# Patient Record
Sex: Male | Born: 1995 | Race: Black or African American | Hispanic: No | Marital: Single | Smoking: Never smoker
Health system: Southern US, Community
[De-identification: ages and names within clinical notes are randomized; demographics above are authoritative.]

---
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
## 2014-01-01 ENCOUNTER — Emergency Department (HOSPITAL_COMMUNITY)
Admission: EM | Admit: 2014-01-01 | Discharge: 2014-01-02 | Disposition: A | Discharge status: Home / Self Care | Payer: BC Managed Care – PPO | Attending: Emergency Medicine | Admitting: Emergency Medicine

## 2014-01-01 ENCOUNTER — Encounter (HOSPITAL_COMMUNITY): Payer: Self-pay | Admitting: Emergency Medicine

## 2014-01-01 DIAGNOSIS — L738 Other specified follicular disorders: Secondary | ICD-10-CM | POA: Insufficient documentation

## 2014-01-01 DIAGNOSIS — R21 Rash and other nonspecific skin eruption: Secondary | ICD-10-CM | POA: Diagnosis present

## 2014-01-01 DIAGNOSIS — L678 Other hair color and hair shaft abnormalities: Secondary | ICD-10-CM | POA: Diagnosis not present

## 2014-01-01 DIAGNOSIS — L739 Follicular disorder, unspecified: Secondary | ICD-10-CM

## 2014-01-01 DIAGNOSIS — Z792 Long term (current) use of antibiotics: Secondary | ICD-10-CM | POA: Diagnosis not present

## 2014-01-01 MED ORDER — MUPIROCIN 2 % EX OINT: TOPICAL_OINTMENT | Freq: Two times a day (BID) | CUTANEOUS | Status: AC

## 2014-01-01 MED ORDER — MUPIROCIN 2 % EX OINT
TOPICAL_OINTMENT | Freq: Two times a day (BID) | CUTANEOUS | Status: DC
  Administered 2014-01-02: via NASAL
  Filled 2014-01-01: qty 22

## 2014-01-01 NOTE — Discharge Instructions (Signed)

## 2014-01-01 NOTE — ED Provider Notes (Signed)
CSN: [id number]     Arrival date & time 01/01/14  2231 History  This chart was scribed for non-physician practitioner, Sabino Dick, NP-C working with Loren Racer, MD, by Luisa Dago, ED scribe. This patient was seen in room WTR9/WTR9 and the patient's care was started at 11:29 PM.      Chief Complaint  Patient presents with  . Rash    right arm   The history is provided by the patient. No language interpreter was used.   HPI Comments: Dillon Baker is a 18 y.o. male who presents to the Emergency Department complaining of a rash to his right arm that he first noticed 1 week ago. Pt reports using a new "tattoo soap" 1.5 weeks ago. He has also endorses applying several lotions and A&D ointment without any relief. Pt is complaining of associated itching. Mr.Surman's new tattoo (35 month old) is located in the same area as the rash. Denies any fever, chills, nausea, emesis, or abdominal pain.  History reviewed. No pertinent past medical history. History reviewed. No pertinent past surgical history. Family History  Problem Relation Age of Onset  . Thyroid disease Mother    History  Substance Use Topics  . Smoking status: Never Smoker   . Smokeless tobacco: Not on file  . Alcohol Use: No    Review of Systems  Constitutional: Negative for fever and chills.  HENT: Negative for congestion.   Gastrointestinal: Negative for abdominal pain.  Musculoskeletal: Negative for joint swelling.  Skin: Positive for rash.   Allergies  Sulfa antibiotics  Home Medications   Prior to Admission medications   Medication Sig Start Date End Date Taking? Authorizing Provider  mupirocin ointment (BACTROBAN) 2 % Place into the nose 2 (two) times daily. To R arm rash 01/01/14   Arman Filter, NP   BP 139/69  Pulse 63  Temp(Src) 98.5 F (36.9 C) (Oral)  Resp 16  SpO2 98%  Physical Exam  Nursing note and vitals reviewed. Constitutional: He is oriented to person, place, and time. He appears  well-developed and well-nourished.  HENT:  Head: Normocephalic.  Eyes: Pupils are equal, round, and reactive to light.  Cardiovascular: Normal rate.   Pulmonary/Chest: Effort normal.  Musculoskeletal: Normal range of motion.  Neurological: He is alert and oriented to person, place, and time.  Skin: Rash noted. No erythema.  Raised lesions along lines of tattoo and at base of hair shafts    ED Course  Procedures (including critical care time)  DIAGNOSTIC STUDIES: Oxygen Saturation is 98% on RA, normal by my interpretation.    COORDINATION OF CARE: 11:36 PM- Pt advised of plan for treatment and pt agrees.  Labs Review Labs Reviewed - No data to display  Imaging Review No results found.   EKG Interpretation None      MDM   Final diagnoses:  Folliculitis     Will give Bactroban ointment   I personally performed the services described in this documentation, which was scribed in my presence. The recorded information has been reviewed and is accurate.    Arman Filter, NP 01/01/14 [phone]

## 2014-01-01 NOTE — ED Notes (Addendum)
Patient c/o rash to right arm. Patient reports new soap @ 1- 1/2 weeks ago, rash started @ 1 week ago. Patient has tried "different types of lotions without relief, and also A&D ointment." Patient c/o some itching. Patient has not taken benadryl at this time.

## 2014-01-02 DIAGNOSIS — L678 Other hair color and hair shaft abnormalities: Secondary | ICD-10-CM | POA: Diagnosis not present

## 2014-01-02 DIAGNOSIS — L738 Other specified follicular disorders: Secondary | ICD-10-CM | POA: Diagnosis not present

## 2014-01-02 NOTE — ED Provider Notes (Signed)
Medical screening examination/treatment/procedure(s) were performed by non-physician practitioner and as supervising physician I was immediately available for consultation/collaboration.   EKG Interpretation None        [name], MD 01/02/14 0216 

## 2014-02-19 ENCOUNTER — Ambulatory Visit: Payer: Federal, State, Local not specified - PPO

## 2014-05-09 ENCOUNTER — Encounter (HOSPITAL_COMMUNITY): Payer: Self-pay

## 2014-05-09 ENCOUNTER — Emergency Department (HOSPITAL_COMMUNITY)
Admission: EM | Admit: 2014-05-09 | Discharge: 2014-05-09 | Disposition: A | Discharge status: Home / Self Care | Payer: BLUE CROSS/BLUE SHIELD | Attending: Emergency Medicine | Admitting: Emergency Medicine

## 2014-05-09 ENCOUNTER — Emergency Department (HOSPITAL_COMMUNITY): Payer: BLUE CROSS/BLUE SHIELD

## 2014-05-09 DIAGNOSIS — J069 Acute upper respiratory infection, unspecified: Secondary | ICD-10-CM | POA: Diagnosis not present

## 2014-05-09 DIAGNOSIS — R059 Cough, unspecified: Secondary | ICD-10-CM

## 2014-05-09 DIAGNOSIS — Z792 Long term (current) use of antibiotics: Secondary | ICD-10-CM | POA: Diagnosis not present

## 2014-05-09 DIAGNOSIS — R509 Fever, unspecified: Secondary | ICD-10-CM | POA: Diagnosis present

## 2014-05-09 DIAGNOSIS — J45909 Unspecified asthma, uncomplicated: Secondary | ICD-10-CM | POA: Insufficient documentation

## 2014-05-09 DIAGNOSIS — Z8701 Personal history of pneumonia (recurrent): Secondary | ICD-10-CM | POA: Insufficient documentation

## 2014-05-09 DIAGNOSIS — R05 Cough: Secondary | ICD-10-CM

## 2014-05-09 MED ORDER — ACETAMINOPHEN 325 MG PO TABS
325.0000 mg | ORAL_TABLET | Freq: Once | ORAL | Status: AC
  Administered 2014-05-09: 325 mg via ORAL
  Filled 2014-05-09: qty 1

## 2014-05-09 MED ORDER — DM-GUAIFENESIN ER 30-600 MG PO TB12: 1.0000 | ORAL_TABLET | Freq: Two times a day (BID) | ORAL | Status: AC

## 2014-05-09 MED ORDER — ALBUTEROL SULFATE HFA 108 (90 BASE) MCG/ACT IN AERS
2.0000 | INHALATION_SPRAY | Freq: Once | RESPIRATORY_TRACT | Status: AC
  Administered 2014-05-09: 2 via RESPIRATORY_TRACT
  Filled 2014-05-09: qty 6.7

## 2014-05-09 NOTE — ED Provider Notes (Signed)
CSN: [id number]     Arrival date & time 05/09/14  1606 History   First MD Initiated Contact with Patient 05/09/14 1744     No chief complaint on file.  The history is provided by the patient. No language interpreter was used.   This chart was scribed for non-physician practitioner Everlene FarrierWilliam Mohammed Mcandrew, PA-C,  working with No att. providers found, by Andrew Auaven Small, ED Scribe. This patient was seen in room WTR6/WTR6 and the patient's care was started at 10:56 PM.  Dillon Baker is a 19 y.o. male who presents to the Emergency Department complaining of subjective fever that began 4 days ago. Pt has taken nyquil last dose being 2 hours ago. He reports associated HA, body aches, fatigue, positional light headedness, decreased appetite, non productive cough. Pt denies sore throat, abdominal pain, otalgia, nasal congestion, chest congestion, chest tightness, wheezing, eye pain, nausea, emesis, SOB and wheezing. Pt denies receiving flu vaccine.    History reviewed. No pertinent past medical history. History reviewed. No pertinent past surgical history. Family History  Problem Relation Age of Onset  . Thyroid disease Mother    History  Substance Use Topics  . Smoking status: Never Smoker   . Smokeless tobacco: Not on file  . Alcohol Use: No    Review of Systems  Constitutional: Positive for fever, chills, appetite change ( decreased) and fatigue.  HENT: Negative for congestion, ear pain and sore throat.   Eyes: Negative for pain.  Respiratory: Negative for cough, shortness of breath and wheezing.   Cardiovascular: Negative for chest pain.  Gastrointestinal: Negative for nausea, vomiting and abdominal distention.  Musculoskeletal: Positive for myalgias.  Neurological: Positive for light-headedness and headaches.   Allergies  Sulfa antibiotics  Home Medications   Prior to Admission medications   Medication Sig Start Date End Date Taking? Authorizing Provider  dextromethorphan-guaiFENesin (MUCINEX  DM) 30-600 MG per 12 hr tablet Take 1 tablet by mouth 2 (two) times daily. 05/09/14   Einar GipWilliam Duncan Elivia Robotham, PA-C  mupirocin ointment (BACTROBAN) 2 % Place into the nose 2 (two) times daily. To R arm rash 01/01/14   Arman FilterGail K Schulz, NP   BP 133/73 mmHg  Pulse 87  Temp(Src) 99.1 F (37.3 C) (Oral)  Resp 20  SpO2 99% Physical Exam  Constitutional: He is oriented to person, place, and time. He appears well-developed and well-nourished. No distress.  HENT:  Head: Normocephalic and atraumatic.  Right Ear: External ear normal.  Left Ear: External ear normal.  Mouth/Throat: Uvula is midline and oropharynx is clear and moist. No oropharyngeal exudate, posterior oropharyngeal edema, posterior oropharyngeal erythema or tonsillar abscesses.  Eyes: Conjunctivae and EOM are normal. Pupils are equal, round, and reactive to light.  Neck: Neck supple.  Cardiovascular: Normal rate, regular rhythm and normal heart sounds.  Exam reveals no gallop and no friction rub.   No murmur heard. Pulmonary/Chest: Effort normal and breath sounds normal. No respiratory distress. He has no wheezes. He has no rales. He exhibits no tenderness.  Abdominal: Soft. There is no tenderness.  Musculoskeletal: Normal range of motion.  Lymphadenopathy:    He has no cervical adenopathy.  Neurological: He is alert and oriented to person, place, and time. Coordination normal.  Skin: Skin is warm and dry.  Psychiatric: He has a normal mood and affect. His behavior is normal.  Nursing note and vitals reviewed.   ED Course  Procedures (including critical care time) DIAGNOSTIC STUDIES: Oxygen Saturation is 100% on RA, normal by my interpretation.  COORDINATION OF CARE: 10:56 PM- Pt advised of plan for treatment and pt agrees.  Labs Review Labs Reviewed - No data to display  Imaging Review Dg Chest 2 View  05/09/2014   CLINICAL DATA:  Three day history of cough, fever, chest congestion, chest pain and headache.  EXAM: CHEST  2  VIEW  COMPARISON:  None.  FINDINGS: Cardiomediastinal silhouette unremarkable. Moderate central peribronchial thickening. Lungs otherwise clear. No localized airspace consolidation. No pleural effusions. No pneumothorax. Normal pulmonary vascularity. Visualized bony thorax intact.  IMPRESSION: Moderate changes of bronchitis and/or asthma without focal airspace pneumonia.   Electronically Signed   By: Hulan Saas M.D.   On: 05/09/2014 18:33     EKG Interpretation None      Filed Vitals:   05/09/14 1628 05/09/14 1934  BP: 133/73   Pulse: 80 87  Temp: 99.1 F (37.3 C)   TempSrc: Oral   Resp: 20   SpO2: 100% 99%     MDM   Meds given in ED:  Medications  acetaminophen (TYLENOL) tablet 325 mg (325 mg Oral Given 05/09/14 1809)  albuterol (PROVENTIL HFA;VENTOLIN HFA) 108 (90 BASE) MCG/ACT inhaler 2 puff (2 puffs Inhalation Given 05/09/14 1932)    Discharge Medication List as of 05/09/2014  7:02 PM    START taking these medications   Details  dextromethorphan-guaiFENesin (MUCINEX DM) 30-600 MG per 12 hr tablet Take 1 tablet by mouth 2 (two) times daily., Starting 05/09/2014, Until Discontinued, Print        Final diagnoses:  Upper respiratory infection, viral   This is a 19 year old male who presents emergency department complaining of 4 days of fever, cough, and myalgias. The patient is afebrile and nontoxic appearing. Patient has history of pneumonia or asthma. Patient's chest x-ray shows moderate changes of bronchitis and/or asthma without focal airspace pneumonia. Patient's lungs are clear to auscultation bilaterally. Patient provided albuterol inhaler in the ED. Patient provided prescription for Mucinex DM. I advised the patient to follow-up with their primary care provider this week. I advised the patient to return to the emergency department with new or worsening symptoms or new concerns. The patient verbalized understanding and agreement with plan.    I personally performed  the services described in this documentation, which was scribed in my presence. The recorded information has been reviewed and is accurate.    Lawana Chambers, PA-C 05/09/14 2259  Purvis Sheffield, MD 05/10/14 [phone]

## 2014-05-09 NOTE — ED Notes (Signed)
Pt with fever, cough, weak, generalized pain since Tuesday.  Pt taking nyquil at home with no relief.  Unknown fever at home.  Did not check.

## 2014-05-09 NOTE — Discharge Instructions (Signed)
Fever, Adult °A fever is a higher than normal body temperature. In an adult, an oral temperature around 98.6° F (37° C) is considered normal. A temperature of 100.4° F (38° C) or higher is generally considered a fever. Mild or moderate fevers generally have no long-term effects and often do not require treatment. Extreme fever (greater than or equal to 106° F or 41.1° C) can cause seizures. The sweating that may occur with repeated or prolonged fever may cause dehydration. Elderly people can develop confusion during a fever. °A measured temperature can vary with: °· Age. °· Time of day. °· Method of measurement (mouth, underarm, rectal, or ear). °The fever is confirmed by taking a temperature with a thermometer. Temperatures can be taken different ways. Some methods are accurate and some are not. °· An oral temperature is used most commonly. Electronic thermometers are fast and accurate. °· An ear temperature will only be accurate if the thermometer is positioned as recommended by the manufacturer. °· A rectal temperature is accurate and done for those adults who have a condition where an oral temperature cannot be taken. °· An underarm (axillary) temperature is not accurate and not recommended. °Fever is a symptom, not a disease.  °CAUSES  °· Infections commonly cause fever. °· Some noninfectious causes for fever include: °· Some arthritis conditions. °· Some thyroid or adrenal gland conditions. °· Some immune system conditions. °· Some types of cancer. °· A medicine reaction. °· High doses of certain street drugs such as methamphetamine. °· Dehydration. °· Exposure to high outside or room temperatures. °· Occasionally, the source of a fever cannot be determined. This is sometimes called a "fever of unknown origin" (FUO). °· Some situations may lead to a temporary rise in body temperature that may go away on its own. Examples are: °· Childbirth. °· Surgery. °· Intense exercise. °HOME CARE INSTRUCTIONS  °· Take  appropriate medicines for fever. Follow dosing instructions carefully. If you use acetaminophen to reduce the fever, be careful to avoid taking other medicines that also contain acetaminophen. Do not take aspirin for a fever if you are younger than age 19. There is an association with Reye's syndrome. Reye's syndrome is a rare but potentially deadly disease. °· If an infection is present and antibiotics have been prescribed, take them as directed. Finish them even if you start to feel better. °· Rest as needed. °· Maintain an adequate fluid intake. To prevent dehydration during an illness with prolonged or recurrent fever, you may need to drink extra fluid. Drink enough fluids to keep your urine clear or pale yellow. °· Sponging or bathing with room temperature water may help reduce body temperature. Do not use ice water or alcohol sponge baths. °· Dress comfortably, but do not over-bundle. °SEEK MEDICAL CARE IF:  °· You are unable to keep fluids down. °· You develop vomiting or diarrhea. °· You are not feeling at least partly better after 3 days. °· You develop new symptoms or problems. °SEEK IMMEDIATE MEDICAL CARE IF:  °· You have shortness of breath or trouble breathing. °· You develop excessive weakness. °· You are dizzy or you faint. °· You are extremely thirsty or you are making little or no urine. °· You develop new pain that was not there before (such as in the head, neck, chest, back, or abdomen). °· You have persistent vomiting and diarrhea for more than 1 to 2 days. °· You develop a stiff neck or your eyes become sensitive to light. °· You develop a   skin rash. °· You have a fever or persistent symptoms for more than 2 to 3 days. °· You have a fever and your symptoms suddenly get worse. °MAKE SURE YOU:  °· Understand these instructions. °· Will watch your condition. °· Will get help right away if you are not doing well or get worse. °Document Released: 10/05/2000 Document Revised: 08/26/2013 Document  Reviewed: 02/10/2011 °ExitCare® Patient Information ©2015 [employer]. This information is not intended to replace advice given to you by your health care provider. Make sure you discuss any questions you have with your health care provider. ° °Upper Respiratory Infection, Adult °An upper respiratory infection (URI) is also sometimes known as the common cold. The upper respiratory tract includes the nose, sinuses, throat, trachea, and bronchi. Bronchi are the airways leading to the lungs. Most people improve within 1 week, but symptoms can last up to 2 weeks. A residual cough may last even longer.  °CAUSES °Many different viruses can infect the tissues lining the upper respiratory tract. The tissues become irritated and inflamed and often become very moist. Mucus production is also common. A cold is contagious. You can easily spread the virus to others by oral contact. This includes kissing, sharing a glass, coughing, or sneezing. Touching your mouth or nose and then touching a surface, which is then touched by another person, can also spread the virus. °SYMPTOMS  °Symptoms typically develop 1 to 3 days after you come in contact with a cold virus. Symptoms vary from person to person. They may include: °· Runny nose. °· Sneezing. °· Nasal congestion. °· Sinus irritation. °· Sore throat. °· Loss of voice (laryngitis). °· Cough. °· Fatigue. °· Muscle aches. °· Loss of appetite. °· Headache. °· Low-grade fever. °DIAGNOSIS  °You might diagnose your own cold based on familiar symptoms, since most people get a cold 2 to 3 times a year. Your caregiver can confirm this based on your exam. Most importantly, your caregiver can check that your symptoms are not due to another disease such as strep throat, sinusitis, pneumonia, asthma, or epiglottitis. Blood tests, throat tests, and X-rays are not necessary to diagnose a common cold, but they may sometimes be helpful in excluding other more serious diseases. Your caregiver will  decide if any further tests are required. °RISKS AND COMPLICATIONS  °You may be at risk for a more severe case of the common cold if you smoke cigarettes, have chronic heart disease (such as heart failure) or lung disease (such as asthma), or if you have a weakened immune system. The very young and very old are also at risk for more serious infections. Bacterial sinusitis, middle ear infections, and bacterial pneumonia can complicate the common cold. The common cold can worsen asthma and chronic obstructive pulmonary disease (COPD). Sometimes, these complications can require emergency medical care and may be life-threatening. °PREVENTION  °The best way to protect against getting a cold is to practice good hygiene. Avoid oral or hand contact with people with cold symptoms. Wash your hands often if contact occurs. There is no clear evidence that vitamin C, vitamin E, echinacea, or exercise reduces the chance of developing a cold. However, it is always recommended to get plenty of rest and practice good nutrition. °TREATMENT  °Treatment is directed at relieving symptoms. There is no cure. Antibiotics are not effective, because the infection is caused by a virus, not by bacteria. Treatment may include: °· Increased fluid intake. Sports drinks offer valuable electrolytes, sugars, and fluids. °· Breathing heated mist   or steam (vaporizer or shower). °· Eating chicken soup or other clear broths, and maintaining good nutrition. °· Getting plenty of rest. °· Using gargles or lozenges for comfort. °· Controlling fevers with ibuprofen or acetaminophen as directed by your caregiver. °· Increasing usage of your inhaler if you have asthma. °Zinc gel and zinc lozenges, taken in the first 24 hours of the common cold, can shorten the duration and lessen the severity of symptoms. Pain medicines may help with fever, muscle aches, and throat pain. A variety of non-prescription medicines are available to treat congestion and runny nose.  Your caregiver can make recommendations and may suggest nasal or lung inhalers for other symptoms.  °HOME CARE INSTRUCTIONS  °· Only take over-the-counter or prescription medicines for pain, discomfort, or fever as directed by your caregiver. °· Use a warm mist humidifier or inhale steam from a shower to increase air moisture. This may keep secretions moist and make it easier to breathe. °· Drink enough water and fluids to keep your urine clear or pale yellow. °· Rest as needed. °· Return to work when your temperature has returned to normal or as your caregiver advises. You may need to stay home longer to avoid infecting others. You can also use a face mask and careful hand washing to prevent spread of the virus. °SEEK MEDICAL CARE IF:  °· After the first few days, you feel you are getting worse rather than better. °· You need your caregiver's advice about medicines to control symptoms. °· You develop chills, worsening shortness of breath, or brown or red sputum. These may be signs of pneumonia. °· You develop yellow or brown nasal discharge or pain in the face, especially when you bend forward. These may be signs of sinusitis. °· You develop a fever, swollen neck glands, pain with swallowing, or white areas in the back of your throat. These may be signs of strep throat. °SEEK IMMEDIATE MEDICAL CARE IF:  °· You have a fever. °· You develop severe or persistent headache, ear pain, sinus pain, or chest pain. °· You develop wheezing, a prolonged cough, cough up blood, or have a change in your usual mucus (if you have chronic lung disease). °· You develop sore muscles or a stiff neck. °Document Released: 10/05/2000 Document Revised: 07/04/2011 Document Reviewed: 07/17/2013 °ExitCare® Patient Information ©2015 [employer]. This information is not intended to replace advice given to you by your health care provider. Make sure you discuss any questions you have with your health care provider. ° °

## 2014-06-04 ENCOUNTER — Emergency Department (HOSPITAL_COMMUNITY): Payer: BLUE CROSS/BLUE SHIELD

## 2014-06-04 ENCOUNTER — Emergency Department (HOSPITAL_COMMUNITY)
Admission: EM | Admit: 2014-06-04 | Discharge: 2014-06-05 | Disposition: A | Discharge status: Home / Self Care | Payer: BLUE CROSS/BLUE SHIELD | Attending: Emergency Medicine | Admitting: Emergency Medicine

## 2014-06-04 ENCOUNTER — Encounter (HOSPITAL_COMMUNITY): Payer: Self-pay | Admitting: Emergency Medicine

## 2014-06-04 DIAGNOSIS — R51 Headache: Secondary | ICD-10-CM | POA: Insufficient documentation

## 2014-06-04 DIAGNOSIS — Z792 Long term (current) use of antibiotics: Secondary | ICD-10-CM | POA: Insufficient documentation

## 2014-06-04 DIAGNOSIS — Z79899 Other long term (current) drug therapy: Secondary | ICD-10-CM | POA: Diagnosis not present

## 2014-06-04 DIAGNOSIS — R519 Headache, unspecified: Secondary | ICD-10-CM

## 2014-06-04 DIAGNOSIS — R1084 Generalized abdominal pain: Secondary | ICD-10-CM | POA: Insufficient documentation

## 2014-06-04 DIAGNOSIS — R112 Nausea with vomiting, unspecified: Secondary | ICD-10-CM | POA: Diagnosis present

## 2014-06-04 LAB — COMPREHENSIVE METABOLIC PANEL
ALK PHOS: 101 U/L (ref 39–117)
ALT: 22 U/L (ref 0–53)
AST: 35 U/L (ref 0–37)
Albumin: 5.3 g/dL — ABNORMAL HIGH (ref 3.5–5.2)
Anion gap: 13 (ref 5–15)
BILIRUBIN TOTAL: 1 mg/dL (ref 0.3–1.2)
BUN: 23 mg/dL (ref 6–23)
CO2: 23 mmol/L (ref 19–32)
Calcium: 10 mg/dL (ref 8.4–10.5)
Chloride: 103 mmol/L (ref 96–112)
Creatinine, Ser: 1.09 mg/dL (ref 0.50–1.35)
Glucose, Bld: 95 mg/dL (ref 70–99)
Potassium: 5.2 mmol/L — ABNORMAL HIGH (ref 3.5–5.1)
Sodium: 139 mmol/L (ref 135–145)
Total Protein: 8.9 g/dL — ABNORMAL HIGH (ref 6.0–8.3)

## 2014-06-04 LAB — CBC WITH DIFFERENTIAL/PLATELET
Basophils Absolute: 0 10*3/uL (ref 0.0–0.1)
Basophils Relative: 0 % (ref 0–1)
Eosinophils Absolute: 0.1 10*3/uL (ref 0.0–0.7)
Eosinophils Relative: 1 % (ref 0–5)
HCT: 49.7 % (ref 39.0–52.0)
HEMOGLOBIN: 16.5 g/dL (ref 13.0–17.0)
LYMPHS ABS: 1.6 10*3/uL (ref 0.7–4.0)
Lymphocytes Relative: 10 % — ABNORMAL LOW (ref 12–46)
MCH: 29.2 pg (ref 26.0–34.0)
MCHC: 33.2 g/dL (ref 30.0–36.0)
MCV: 88 fL (ref 78.0–100.0)
MONOS PCT: 4 % (ref 3–12)
Monocytes Absolute: 0.6 10*3/uL (ref 0.1–1.0)
NEUTROS PCT: 85 % — AB (ref 43–77)
Neutro Abs: 13.5 10*3/uL — ABNORMAL HIGH (ref 1.7–7.7)
Platelets: 224 10*3/uL (ref 150–400)
RBC: 5.65 MIL/uL (ref 4.22–5.81)
RDW: 12.8 % (ref 11.5–15.5)
WBC: 15.8 10*3/uL — ABNORMAL HIGH (ref 4.0–10.5)

## 2014-06-04 LAB — LIPASE, BLOOD: Lipase: 21 U/L (ref 11–59)

## 2014-06-04 MED ORDER — ONDANSETRON HCL 4 MG/2ML IJ SOLN
4.0000 mg | Freq: Once | INTRAMUSCULAR | Status: AC
  Administered 2014-06-04: 4 mg via INTRAVENOUS
  Filled 2014-06-04: qty 2

## 2014-06-04 MED ORDER — SODIUM CHLORIDE 0.9 % IV BOLUS (SEPSIS)
1000.0000 mL | Freq: Once | INTRAVENOUS | Status: AC
  Administered 2014-06-04: 1000 mL via INTRAVENOUS

## 2014-06-04 MED ORDER — FENTANYL CITRATE 0.05 MG/ML IJ SOLN
50.0000 ug | Freq: Once | INTRAMUSCULAR | Status: AC
  Administered 2014-06-04: 50 ug via INTRAVENOUS
  Filled 2014-06-04: qty 2

## 2014-06-04 NOTE — ED Notes (Signed)
Patient states that he started having a headache and vomiting approx. 15-20 mins ago. Alert and oriented.

## 2014-06-04 NOTE — ED Provider Notes (Signed)
CSN: [id number]     Arrival date & time 06/04/14  2201 History   First MD Initiated Contact with Patient 06/04/14 2301     Chief Complaint  Patient presents with  . Emesis     (Consider location/radiation/quality/duration/timing/severity/associated sxs/prior Treatment) HPI Patient presents with multiple episodes of forceful vomiting starting within the last hour. Patient states he's vomited 5 times. Patient denies any diarrhea. He has mild diffuse abdominal pain. No fever or chills. Patient states that after several episodes of vomiting he developed a right-sided headache. This is gradual onset. Not associated with photophobia or visual changes. No neck pain or stiffness. Describes the headache as throbbing and located in the right frontal area. The headache has improved since the vomiting has subsided. Patient denies similar headaches in the past. No focal weakness or numbness. Patient last ate [employer] 7 hours ago. No known sick contacts. No recent travel history. History reviewed. No pertinent past medical history. History reviewed. No pertinent past surgical history. Family History  Problem Relation Age of Onset  . Thyroid disease Mother    History  Substance Use Topics  . Smoking status: Never Smoker   . Smokeless tobacco: Not on file  . Alcohol Use: No    Review of Systems  Constitutional: Negative for fever and chills.  HENT: Negative for congestion, rhinorrhea and sinus pressure.   Eyes: Negative for photophobia and visual disturbance.  Respiratory: Negative for shortness of breath.   Cardiovascular: Negative for chest pain.  Gastrointestinal: Positive for nausea, vomiting and abdominal pain. Negative for diarrhea, constipation and blood in stool.  Musculoskeletal: Negative for back pain, neck pain and neck stiffness.  Neurological: Positive for headaches. Negative for dizziness, syncope, weakness, light-headedness and numbness.  All other systems reviewed and are  negative.     Allergies  Sulfa antibiotics  Home Medications   Prior to Admission medications   Medication Sig Start Date End Date Taking? Authorizing Provider  albuterol (PROVENTIL HFA;VENTOLIN HFA) 108 (90 BASE) MCG/ACT inhaler Inhale 2 puffs into the lungs every 6 (six) hours as needed for wheezing or shortness of breath.   Yes Historical Provider, MD  dextromethorphan-guaiFENesin (MUCINEX DM) 30-600 MG per 12 hr tablet Take 1 tablet by mouth 2 (two) times daily. Patient not taking: Reported on 06/04/2014 05/09/14   Einar GipWilliam Duncan Dansie, PA-C  mupirocin ointment (BACTROBAN) 2 % Place into the nose 2 (two) times daily. To R arm rash Patient not taking: Reported on 06/04/2014 01/01/14   Arman FilterGail K Schulz, NP   BP 126/79 mmHg  Pulse 98  Temp(Src) 98.1 F (36.7 C)  Resp 20  SpO2 100% Physical Exam  Constitutional: He is oriented to person, place, and time. He appears well-developed and well-nourished. No distress.  HENT:  Head: Normocephalic and atraumatic.  Mouth/Throat: Oropharynx is clear and moist. No oropharyngeal exudate.  No sinus tenderness to percussion.  Eyes: EOM are normal. Pupils are equal, round, and reactive to light.  No nystagmus  Neck: Normal range of motion. Neck supple.  No meningismus  Cardiovascular: Normal rate and regular rhythm.  Exam reveals no gallop and no friction rub.   No murmur heard. Pulmonary/Chest: Effort normal and breath sounds normal. No respiratory distress. He has no wheezes. He has no rales. He exhibits no tenderness.  Abdominal: Soft. Bowel sounds are normal. He exhibits no distension and no mass. There is tenderness (very mild diffuse tenderness to palpation. No focality. No rebound or guarding.). There is no rebound and no guarding.  Musculoskeletal:  Normal range of motion. He exhibits no edema or tenderness.  Neurological: He is alert and oriented to person, place, and time.  Patient is alert and oriented x3 with clear, goal oriented speech.  Patient has 5/5 motor in all extremities. Sensation is intact to light touch. Bilateral finger-to-nose is normal with no signs of dysmetria. Patient has a normal gait and walks without assistance.  Skin: Skin is warm and dry. No rash noted. No erythema.  Psychiatric: He has a normal mood and affect. His behavior is normal.  Nursing note and vitals reviewed.   ED Course  Procedures (including critical care time) Labs Review Labs Reviewed  CBC WITH DIFFERENTIAL/PLATELET - Abnormal; Notable for the following:    WBC 15.8 (*)    Neutrophils Relative % 85 (*)    Neutro Abs 13.5 (*)    Lymphocytes Relative 10 (*)    All other components within normal limits  COMPREHENSIVE METABOLIC PANEL - Abnormal; Notable for the following:    Potassium 5.2 (*)    Total Protein 8.9 (*)    Albumin 5.3 (*)    All other components within normal limits  LIPASE, BLOOD    Imaging Review Ct Head Wo Contrast  06/05/2014   CLINICAL DATA:  Headache and vomiting about 20 min ago.  EXAM: CT HEAD WITHOUT CONTRAST  TECHNIQUE: Contiguous axial images were obtained from the base of the skull through the vertex without intravenous contrast.  COMPARISON:  None.  FINDINGS: Ventricles and sulci appear symmetrical. No mass effect or midline shift. No abnormal extra-axial fluid collections. Gray-white matter junctions are distinct. Basal cisterns are not effaced. No evidence of acute intracranial hemorrhage. No depressed skull fractures. Visualized paranasal sinuses and mastoid air cells are not opacified.  IMPRESSION: No acute intracranial abnormalities.  Normal examination.   Electronically Signed   By: Burman Nieves M.D.   On: 06/05/2014 00:50     EKG Interpretation None      MDM   Final diagnoses:  None    Patient presents with acute vomiting. States he developed a right-sided headache shortly after starting vomiting. He attributes the headache to the vomiting. He has a normal neurologic exam. He states the  headache is improved. There is no neck pain or stiffness. No fever or chills. Low suspicion for [facility] though believe that a CT scan effectively exclude this given onset 1 hour ago.   Headache is resolved. Neurologic exam remains normal. No further vomiting. Patient states the feeling much better. CT head without any acute findings. Discharge home with return precautions.  Loren Racer, MD 06/05/14 [phone]

## 2014-06-05 MED ORDER — ONDANSETRON 4 MG PO TBDP: ORAL_TABLET | ORAL | Status: AC

## 2014-06-05 NOTE — Discharge Instructions (Signed)
Nausea and Vomiting °Nausea is a sick feeling that often comes before throwing up (vomiting). Vomiting is a reflex where stomach contents come out of your mouth. Vomiting can cause severe loss of body fluids (dehydration). Children and elderly adults can become dehydrated quickly, especially if they also have diarrhea. Nausea and vomiting are symptoms of a condition or disease. It is important to find the cause of your symptoms. °CAUSES  °· Direct irritation of the stomach lining. This irritation can result from increased acid production (gastroesophageal reflux disease), infection, food poisoning, taking certain medicines (such as nonsteroidal anti-inflammatory drugs), alcohol use, or tobacco use. °· Signals from the brain. These signals could be caused by a headache, heat exposure, an inner ear disturbance, increased pressure in the brain from injury, infection, a tumor, or a concussion, pain, emotional stimulus, or metabolic problems. °· An obstruction in the gastrointestinal tract (bowel obstruction). °· Illnesses such as diabetes, hepatitis, gallbladder problems, appendicitis, kidney problems, cancer, sepsis, atypical symptoms of a heart attack, or eating disorders. °· Medical treatments such as chemotherapy and radiation. °· Receiving medicine that makes you sleep (general anesthetic) during surgery. °DIAGNOSIS °Your caregiver may ask for tests to be done if the problems do not improve after a few days. Tests may also be done if symptoms are severe or if the reason for the nausea and vomiting is not clear. Tests may include: °· Urine tests. °· Blood tests. °· Stool tests. °· Cultures (to look for evidence of infection). °· X-rays or other imaging studies. °Test results can help your caregiver make decisions about treatment or the need for additional tests. °TREATMENT °You need to stay well hydrated. Drink frequently but in small amounts. You may wish to drink water, sports drinks, clear broth, or eat frozen  ice pops or gelatin dessert to help stay hydrated. When you eat, eating slowly may help prevent nausea. There are also some antinausea medicines that may help prevent nausea. °HOME CARE INSTRUCTIONS  °· Take all medicine as directed by your caregiver. °· If you do not have an appetite, do not force yourself to eat. However, you must continue to drink fluids. °· If you have an appetite, eat a normal diet unless your caregiver tells you differently. °· Eat a variety of complex carbohydrates (rice, wheat, potatoes, bread), lean meats, yogurt, fruits, and vegetables. °· Avoid high-fat foods because they are more difficult to digest. °· Drink enough water and fluids to keep your urine clear or pale yellow. °· If you are dehydrated, ask your caregiver for specific rehydration instructions. Signs of dehydration may include: °· Severe thirst. °· Dry lips and mouth. °· Dizziness. °· Dark urine. °· Decreasing urine frequency and amount. °· Confusion. °· Rapid breathing or pulse. °SEEK IMMEDIATE MEDICAL CARE IF:  °· You have blood or brown flecks (like coffee grounds) in your vomit. °· You have black or bloody stools. °· You have a severe headache or stiff neck. °· You are confused. °· You have severe abdominal pain. °· You have chest pain or trouble breathing. °· You do not urinate at least once every 8 hours. °· You develop cold or clammy skin. °· You continue to vomit for longer than 24 to 48 hours. °· You have a fever. °MAKE SURE YOU:  °· Understand these instructions. °· Will watch your condition. °· Will get help right away if you are not doing well or get worse. °Document Released: 04/11/2005 Document Revised: 07/04/2011 Document Reviewed: 09/08/2010 °[employer]® Patient Information ©2015 [employer]. This information is not intended   to replace advice given to you by your health care provider. Make sure you discuss any questions you have with your health care provider. °General Headache Without Cause °A headache is pain  or discomfort felt around the head or neck area. The specific cause of a headache may not be found. There are many causes and types of headaches. A few common ones are: °· Tension headaches. °· Migraine headaches. °· Cluster headaches. °· Chronic daily headaches. °HOME CARE INSTRUCTIONS  °· Keep all follow-up appointments with your caregiver or any specialist referral. °· Only take over-the-counter or prescription medicines for pain or discomfort as directed by your caregiver. °· Lie down in a dark, quiet room when you have a headache. °· Keep a headache journal to find out what may trigger your migraine headaches. For example, write down: °¨ What you eat and drink. °¨ How much sleep you get. °¨ Any change to your diet or medicines. °· Try massage or other relaxation techniques. °· Put ice packs or heat on the head and neck. Use these 3 to 4 times per day for 15 to 20 minutes each time, or as needed. °· Limit stress. °· Sit up straight, and do not tense your muscles. °· Quit smoking if you smoke. °· Limit alcohol use. °· Decrease the amount of caffeine you drink, or stop drinking caffeine. °· Eat and sleep on a regular schedule. °· Get 7 to 9 hours of sleep, or as recommended by your caregiver. °· Keep lights dim if bright lights bother you and make your headaches worse. °SEEK MEDICAL CARE IF:  °· You have problems with the medicines you were prescribed. °· Your medicines are not working. °· You have a change from the usual headache. °· You have nausea or vomiting. °SEEK IMMEDIATE MEDICAL CARE IF:  °· Your headache becomes severe. °· You have a fever. °· You have a stiff neck. °· You have loss of vision. °· You have muscular weakness or loss of muscle control. °· You start losing your balance or have trouble walking. °· You feel faint or pass out. °· You have severe symptoms that are different from your first symptoms. °MAKE SURE YOU:  °· Understand these instructions. °· Will watch your condition. °· Will get help  right away if you are not doing well or get worse. °Document Released: 04/11/2005 Document Revised: 07/04/2011 Document Reviewed: 04/27/2011 °[employer]® Patient Information ©2015 [employer]. This information is not intended to replace advice given to you by your health care provider. Make sure you discuss any questions you have with your health care provider. ° °

## 2016-09-15 IMAGING — CR DG CHEST 2V
2 series · 2 of 2 positions shown · non-contrast
Comparison: None.

CLINICAL DATA: Three day history of cough, fever, chest congestion,
chest pain and headache.

EXAM:
CHEST  2 VIEW

[w chest pa]
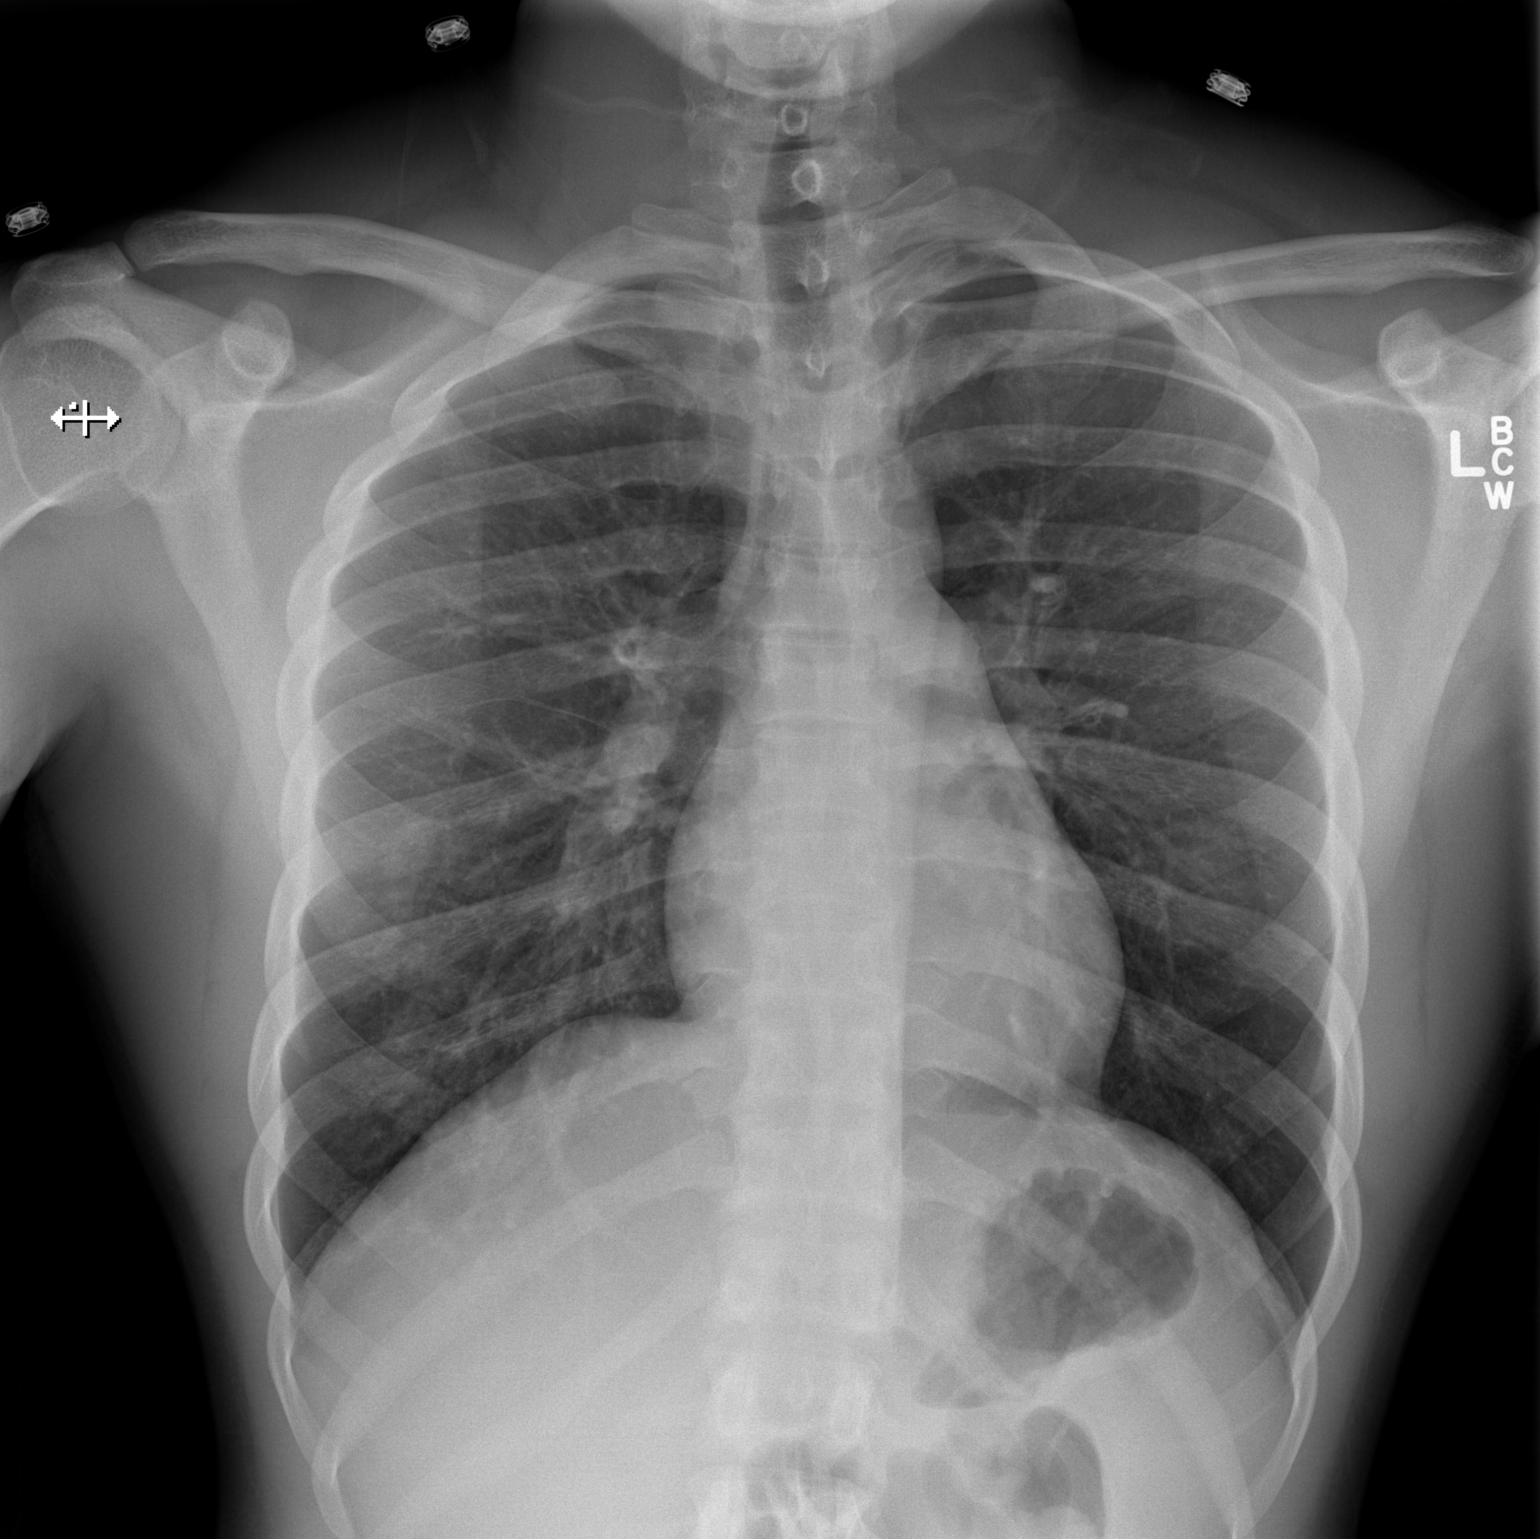

[w chest lat]
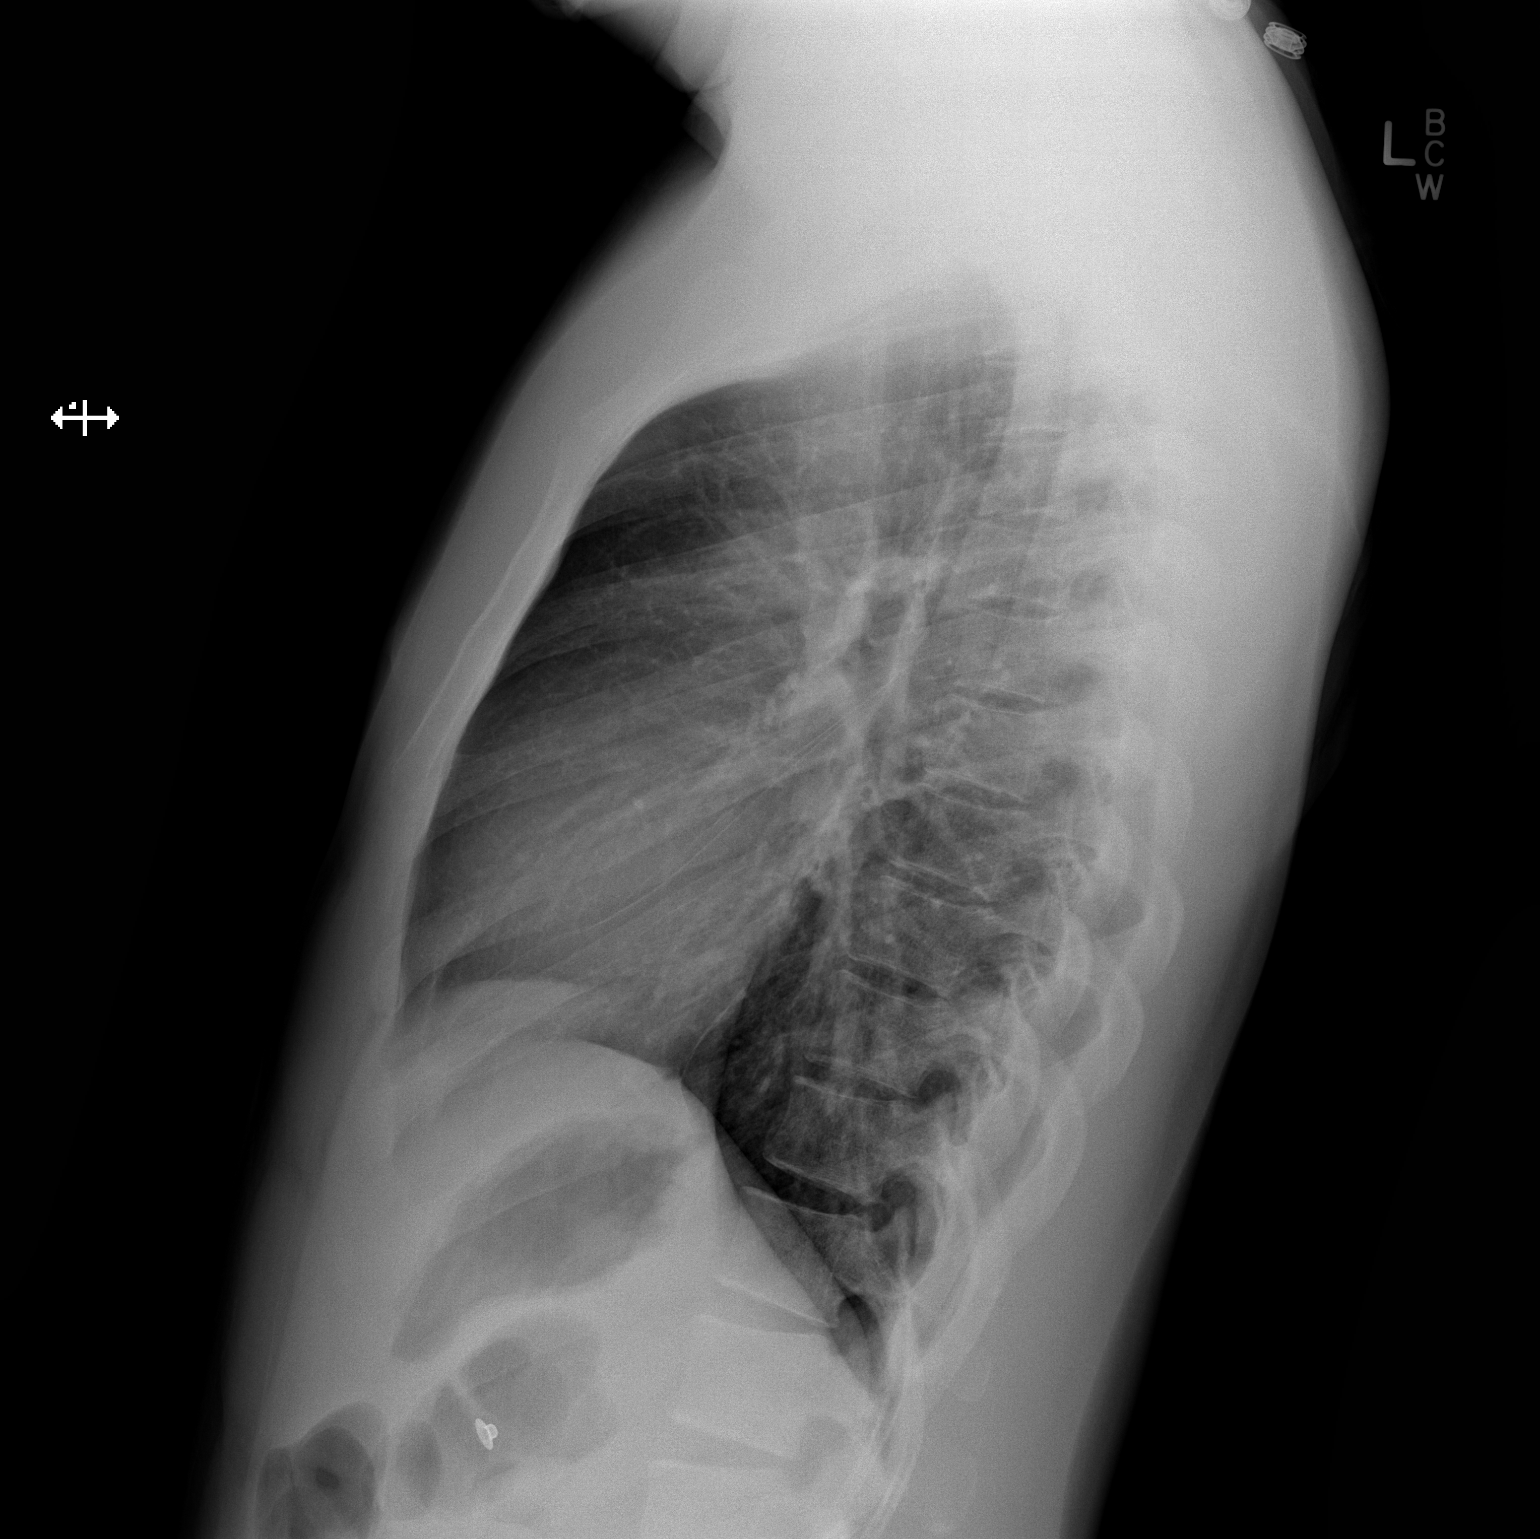

[2 of 2 positions shown; findings below may reference images not displayed]

FINDINGS: Cardiomediastinal silhouette unremarkable. Moderate central
peribronchial thickening. Lungs otherwise clear. No localized
airspace consolidation. No pleural effusions. No pneumothorax.
Normal pulmonary vascularity. Visualized bony thorax intact.
IMPRESSION: Moderate changes of bronchitis and/or asthma without focal airspace
pneumonia.

## 2016-10-12 IMAGING — CT CT HEAD W/O CM
2 series · 17 of 30 positions shown, 20 images · non-contrast
Comparison: None.

CLINICAL DATA: Headache and vomiting about 20 min ago.

EXAM:
CT HEAD WITHOUT CONTRAST
TECHNIQUE: Contiguous axial images were obtained from the base of the skull
through the vertex without intravenous contrast.

[Series 2: head w/o · axial · non-contrast · 0.43mm/px · z∈[+1203,+1338]mm · 9 of 35 slices shown, 12 images]
[im 4/35  brain]
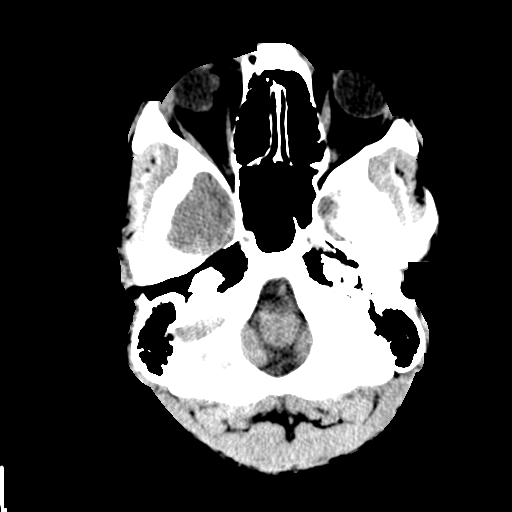
[im 4/35  bone]
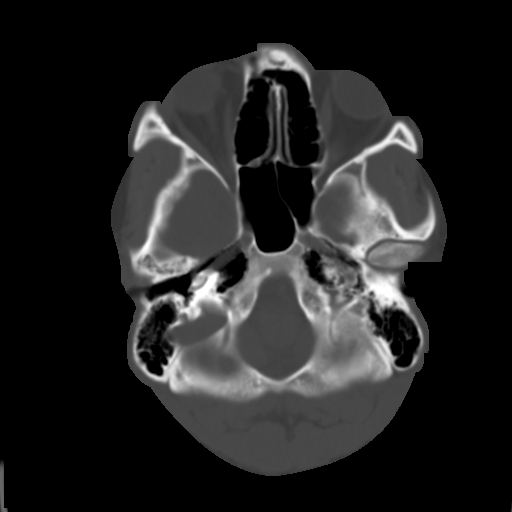
[im 7/35  brain]
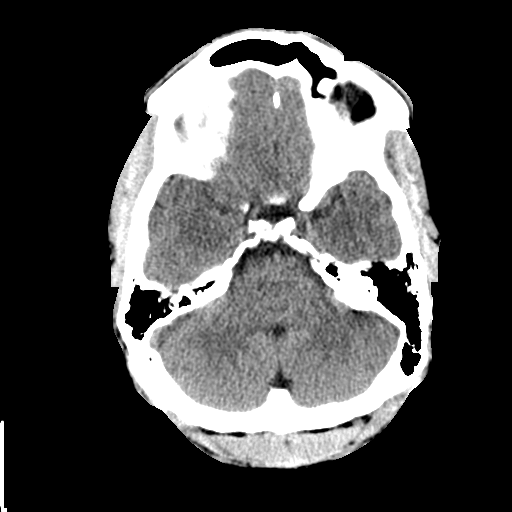
[im 11/35  brain]
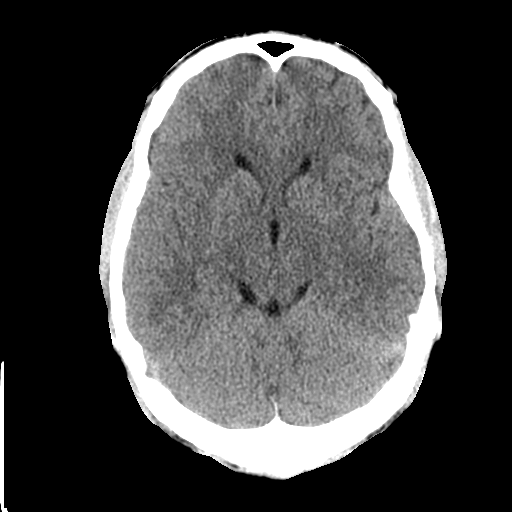
[im 14/35  brain]
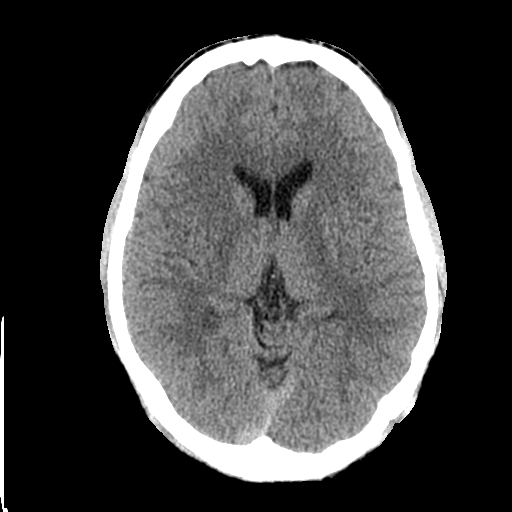
[im 18/35  brain]
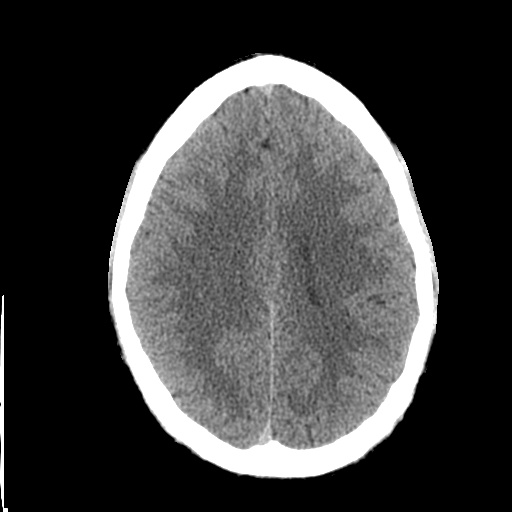
[im 18/35  bone]
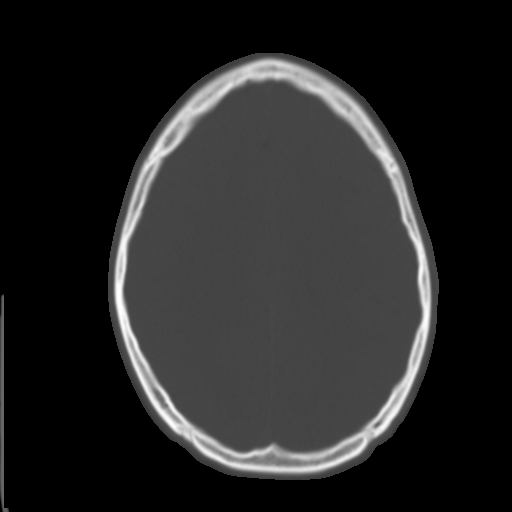
[im 21/35  brain]
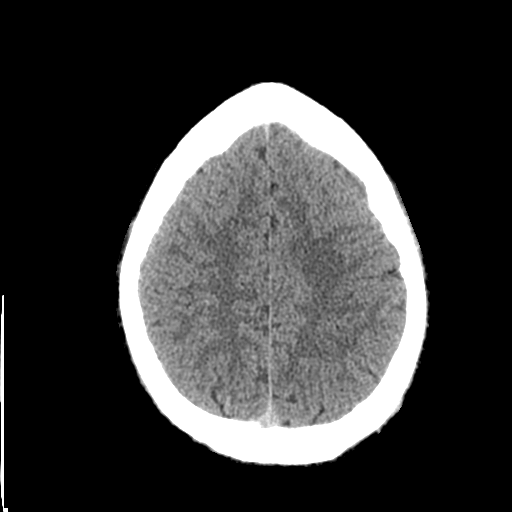
[im 24/35  brain]
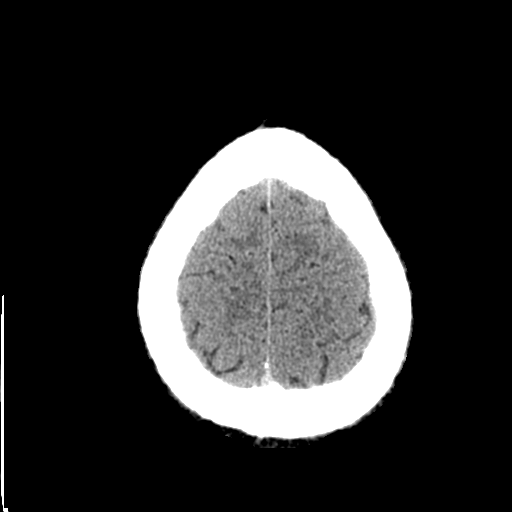
[im 28/35  brain]
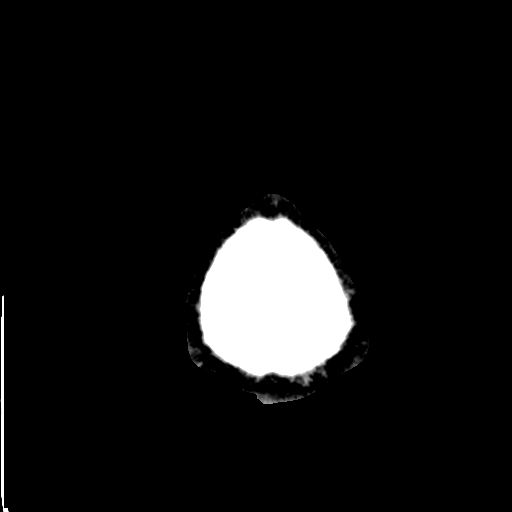
[im 31/35  brain]
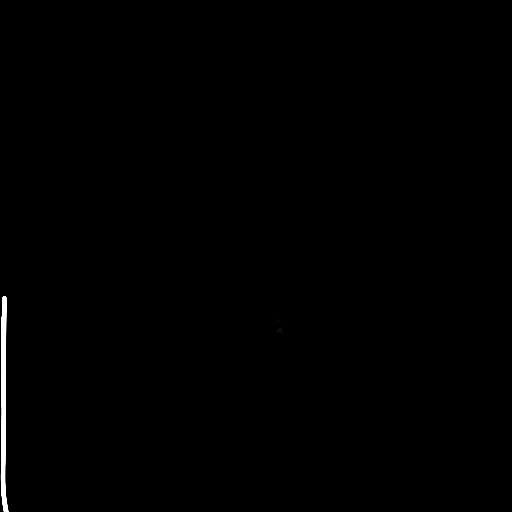
[im 31/35  bone]
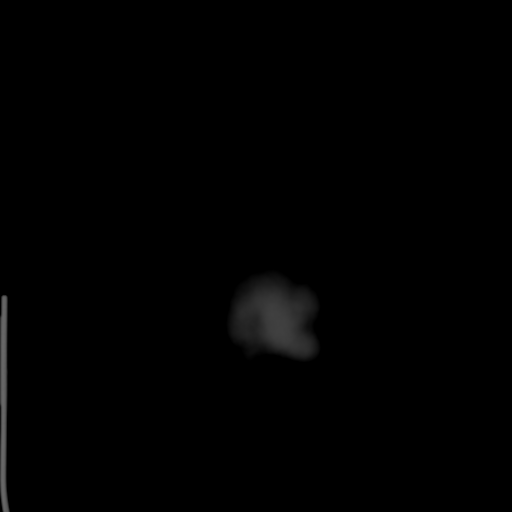

[Series 3: bone windows · axial · 0.43mm/px · z∈[+1206,+1335]mm · 8 of 57 slices shown]
[im 7/57  bone]
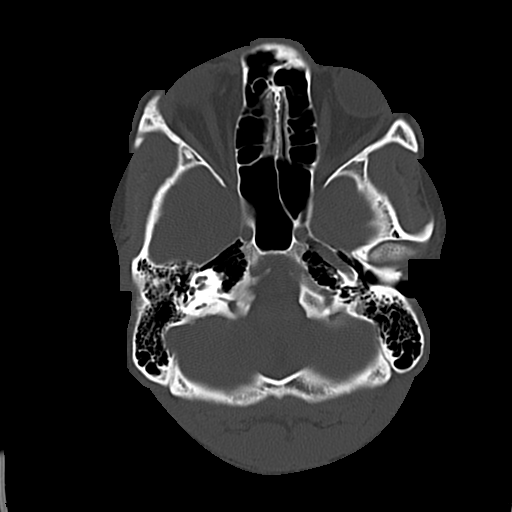
[im 13/57  bone]
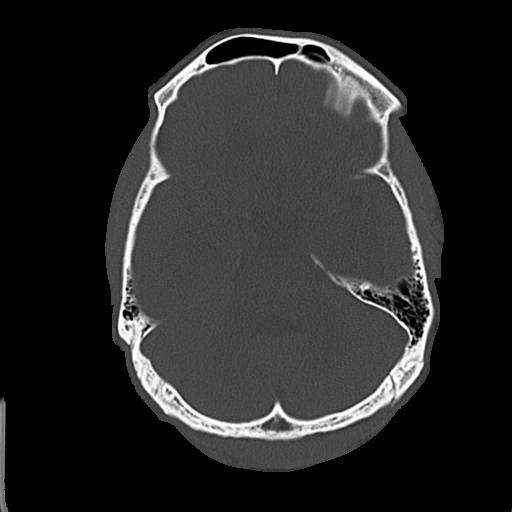
[im 19/57  bone]
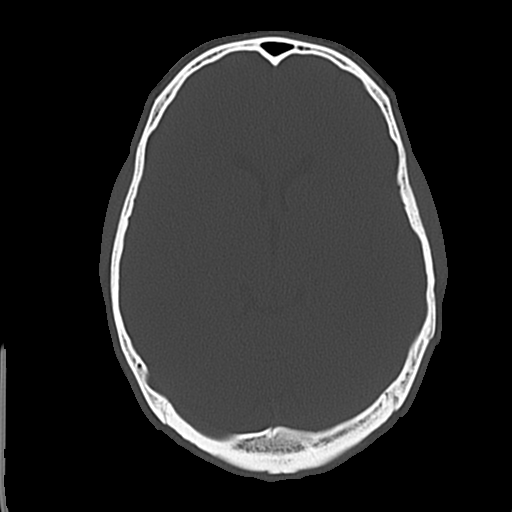
[im 25/57  bone]
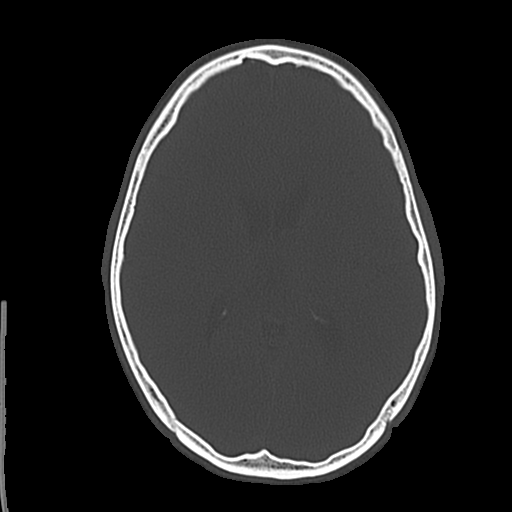
[im 32/57  bone]
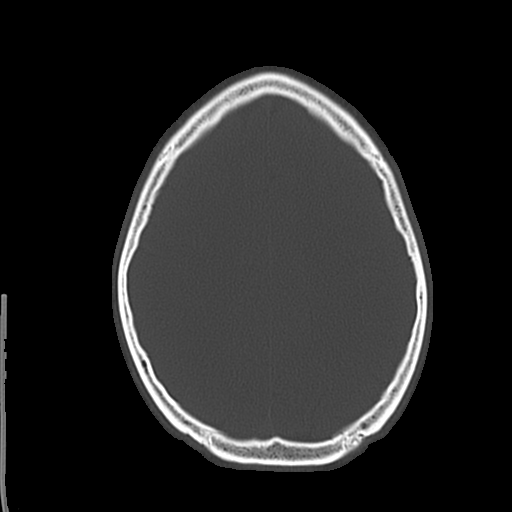
[im 38/57  bone]
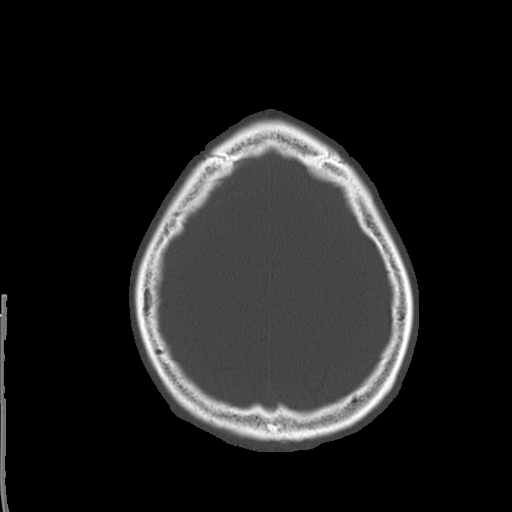
[im 44/57  bone]
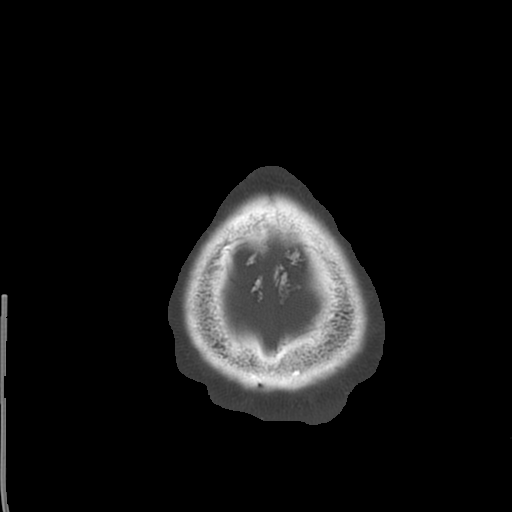
[im 50/57  bone]
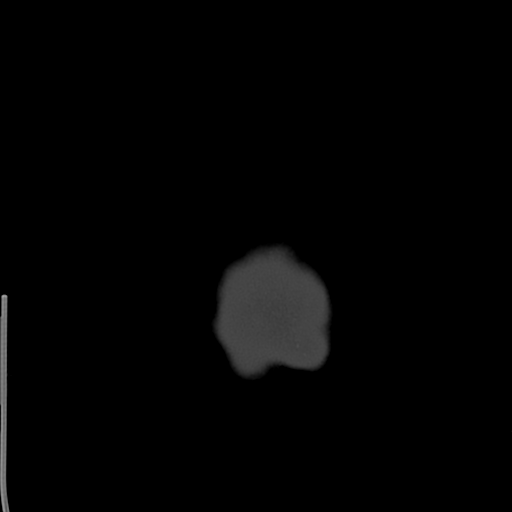

[17 of 30 positions shown; findings below may reference images not displayed]

FINDINGS: Ventricles and sulci appear symmetrical. No mass effect or midline
shift. No abnormal extra-axial fluid collections. Gray-white matter
junctions are distinct. Basal cisterns are not effaced. No evidence
of acute intracranial hemorrhage. No depressed skull fractures.
Visualized paranasal sinuses and mastoid air cells are not
opacified.
IMPRESSION: No acute intracranial abnormalities.  Normal examination.
